# Patient Record
Sex: Female | Born: 2000 | Race: White | Hispanic: No | Marital: Single | Smoking: Never smoker
Health system: Southern US, Community
[De-identification: ages and names within clinical notes are randomized; demographics above are authoritative.]

## PROBLEM LIST (undated history)

## (undated) HISTORY — PX: NO PAST SURGERIES: SHX2092

---

## 2018-12-11 ENCOUNTER — Other Ambulatory Visit: Payer: Self-pay

## 2018-12-11 ENCOUNTER — Ambulatory Visit
Admission: EM | Admit: 2018-12-11 | Discharge: 2018-12-11 | Disposition: A | Discharge status: Home / Self Care | Payer: Medicaid Other | Attending: Emergency Medicine | Admitting: Emergency Medicine

## 2018-12-11 DIAGNOSIS — J029 Acute pharyngitis, unspecified: Secondary | ICD-10-CM | POA: Insufficient documentation

## 2018-12-11 DIAGNOSIS — J0100 Acute maxillary sinusitis, unspecified: Secondary | ICD-10-CM | POA: Diagnosis not present

## 2018-12-11 DIAGNOSIS — J0101 Acute recurrent maxillary sinusitis: Secondary | ICD-10-CM | POA: Diagnosis present

## 2018-12-11 LAB — RAPID STREP SCREEN (MED CTR MEBANE ONLY): Streptococcus, Group A Screen (Direct): NEGATIVE

## 2018-12-11 MED ORDER — AMOXICILLIN-POT CLAVULANATE 875-125 MG PO TABS: 1.0000 | ORAL_TABLET | Freq: Two times a day (BID) | ORAL | 0 refills | Status: DC

## 2018-12-11 NOTE — ED Triage Notes (Signed)
Patient complains of sore throat and fevers x 6 days.

## 2018-12-11 NOTE — Discharge Instructions (Addendum)
Take medication as prescribed. Rest. Drink plenty of fluids.  ° °Follow up with your primary care physician this week as needed. Return to Urgent care for new or worsening concerns.  ° °

## 2018-12-11 NOTE — ED Provider Notes (Signed)
[facility] ____________________________________________  Time seen: Approximately 1230 PM  I have reviewed the triage vital signs and the nursing notes.   HISTORY  Chief Complaint Sore Throat (APPT)   HPI Nichole Horton is a 17 y.o. female presenting for evaluation of 1 week of nasal congestion, sinus pressure, postnasal drainage and sore throat.  States occasional cough.  States sore throat is currently mild.  Continues to overall eat and drink well.  States the last 2 days she has had low-grade fevers at home subjectively.  Has been taken over-the-counter cough and congestion medication as well as Tylenol as needed which helps but no resolution.  Denies chest pain, shortness of breath or abdominal pain.  Denies recent sickness of his antibiotic use.  Reports otherwise doing well.  Patient's last menstrual period was 12/04/2018.  Denies pregnancy.  History reviewed. No pertinent past medical history.  There are no active problems to display for this patient.   Past Surgical History:  Procedure Laterality Date  . NO PAST SURGERIES       No current facility-administered medications for this encounter.   Current Outpatient Medications:  .  amoxicillin-clavulanate (AUGMENTIN) 875-125 MG tablet, Take 1 tablet by mouth every 12 (twelve) hours., Disp: 20 tablet, Rfl: 0  Allergies Patient has no known allergies.  History reviewed. No pertinent family history.  Social History Social History   Tobacco Use  . Smoking status: Never Smoker  . Smokeless tobacco: Never Used  Substance Use Topics  . Alcohol use: Not Currently  . Drug use: Not Currently    Review of Systems Constitutional: possible fevers.  ENT: as above Cardiovascular: Denies chest pain. Respiratory: Denies shortness of breath. Gastrointestinal: No abdominal pain.  Musculoskeletal: Negative for back pain. Skin: Negative for rash.   ____________________________________________   PHYSICAL  EXAM:  VITAL SIGNS: ED Triage Vitals  Enc Vitals Group     BP 12/11/18 1140 (!) 134/92     Pulse Rate 12/11/18 1140 88     Resp 12/11/18 1140 18     Temp 12/11/18 1140 98.5 F (36.9 C)     Temp Source 12/11/18 1140 Oral     SpO2 12/11/18 1140 100 %     Weight 12/11/18 1138 160 lb (72.6 kg)     Height 12/11/18 1138 5\' 4"  (1.626 m)     Head Circumference --      Peak Flow --      Pain Score 12/11/18 1138 6     Pain Loc --      Pain Edu? --      Excl. in GC? --     Constitutional: Alert and oriented. Well appearing and in no acute distress. Eyes: Conjunctivae are normal.  Head: Atraumatic.Mild to moderate tenderness to palpation bilateral maxillary sinuses.  Nontender frontal sinuses.  No swelling. No erythema.   Ears: no erythema, normal TMs bilaterally.   Nose: nasal congestion with bilateral nasal turbinate erythema and edema.   Mouth/Throat: Mucous membranes are moist.  Mild pharyngeal erythema.  No tonsillar swelling or exudate.  Neck: No stridor.  No cervical spine tenderness to palpation. Hematological/Lymphatic/Immunilogical: No cervical lymphadenopathy. Cardiovascular: Normal rate, regular rhythm. Grossly normal heart sounds.  Good peripheral circulation. Respiratory: Normal respiratory effort.  No retractions. No wheezes, rales or rhonchi. Good air movement.  Musculoskeletal: No cervical, thoracic or lumbar tenderness to palpation.  Neurologic:  Normal speech and language.No gait instability. Skin:  Skin is warm, dry and intact. No rash noted. Psychiatric: Mood and affect  are normal. Speech and behavior are normal.  ___________________________________________   LABS (all labs ordered are listed, but only abnormal results are displayed)  Labs Reviewed  RAPID STREP SCREEN ([facility] ONLY)  CULTURE, GROUP A STREP [facility])    PROCEDURES Procedures    INITIAL IMPRESSION / ASSESSMENT AND PLAN / ED COURSE  Pertinent labs & imaging results that were available  during my care of the patient were reviewed by me and considered in my medical decision making (see chart for details).  Well-appearing patient.  No acute distress.  Strep negative, will culture.  Suspect sinusitis with postnasal drainage.  Will treat with oral Augmentin.  Encourage rest, fluids, supportive care and over-the-counter medications.Discussed indication, risks and benefits of medications with patient.  Discussed follow up with Primary care physician this week. Discussed follow up and return parameters including no resolution or any worsening concerns. Patient verbalized understanding and agreed to plan.   ____________________________________________   FINAL CLINICAL IMPRESSION(S) / ED DIAGNOSES  Final diagnoses:  Acute maxillary sinusitis, recurrence not specified  Pharyngitis, unspecified etiology     ED Discharge Orders         Ordered    amoxicillin-clavulanate (AUGMENTIN) 875-125 MG tablet  Every 12 hours     12/11/18 1241           Note: This dictation was prepared with Dragon dictation along with smaller phrase technology. Any transcriptional errors that result from this process are unintentional.         Renford DillsMiller, Erubiel Manasco, NP 12/11/18 1455

## 2018-12-14 LAB — CULTURE, GROUP A STREP (THRC)

## 2019-07-24 ENCOUNTER — Encounter: Payer: Self-pay | Admitting: Emergency Medicine

## 2019-07-24 ENCOUNTER — Other Ambulatory Visit: Payer: Self-pay

## 2019-07-24 ENCOUNTER — Ambulatory Visit
Admission: EM | Admit: 2019-07-24 | Discharge: 2019-07-24 | Disposition: A | Discharge status: Home / Self Care | Payer: Medicaid Other | Attending: Emergency Medicine | Admitting: Emergency Medicine

## 2019-07-24 ENCOUNTER — Ambulatory Visit (INDEPENDENT_AMBULATORY_CARE_PROVIDER_SITE_OTHER): Payer: Self-pay

## 2019-07-24 DIAGNOSIS — M7751 Other enthesopathy of right foot: Secondary | ICD-10-CM

## 2019-07-24 DIAGNOSIS — M79671 Pain in right foot: Secondary | ICD-10-CM

## 2019-07-24 MED ORDER — MELOXICAM 15 MG PO TABS: 15.0000 mg | ORAL_TABLET | Freq: Every day | ORAL | 0 refills | Status: DC | PRN

## 2019-07-24 NOTE — ED Triage Notes (Signed)
Patient c/o right foot pain that started 2 weeks ago. She denies injury but reports very painful to walk on it.

## 2019-07-24 NOTE — ED Provider Notes (Signed)
[facility] ____________________________________________  Time seen: Approximately 10:31 AM  I have reviewed the triage vital signs and the nursing notes.   HISTORY  Chief Complaint Foot Pain   HPI Nichole Horton is a 18 y.o. female presenting for evaluation of right lateral foot pain present for approximately 2 weeks.  Patient states pain is primarily with walking and activity.  States minimal pain at rest.  Unresolved with occasional over-the-counter Aleve.  Patient reports that she is often on her feet and walking as she works most days and has to walk a lot.  Patient states around 2 weeks ago while she was walking she accidentally inverted her foot and unsure if this is what caused pain.  Has continued remain ambulatory.  Denies pain radiation, paresthesias or other discomfort.  States that she knows she has some calluses to bottom of both of her feet, but reports this is chronic and unchanged and not tender at callus site.  Denies recent cough, congestion, chest pain, shortness of breath or fever.  Patient's last menstrual period was 07/23/2019.  Denies pregnancy.     History reviewed. No pertinent past medical history.  There are no active problems to display for this patient.   Past Surgical History:  Procedure Laterality Date  . NO PAST SURGERIES       No current facility-administered medications for this encounter.   Current Outpatient Medications:  .  meloxicam (MOBIC) 15 MG tablet, Take 1 tablet (15 mg total) by mouth daily as needed., Disp: 14 tablet, Rfl: 0  Allergies Patient has no known allergies.  History reviewed. No pertinent family history.  Social History Social History   Tobacco Use  . Smoking status: Never Smoker  . Smokeless tobacco: Never Used  Substance Use Topics  . Alcohol use: Not Currently  . Drug use: Not Currently    Review of Systems Constitutional: No fever Cardiovascular: Denies chest pain. Respiratory: Denies  shortness of breath. Gastrointestinal: No abdominal pain.   Musculoskeletal: Positive right foot pain. Skin: Negative for rash.   ____________________________________________   PHYSICAL EXAM:  VITAL SIGNS: ED Triage Vitals  Enc Vitals Group     BP 07/24/19 0925 117/76     Pulse Rate 07/24/19 0925 85     Resp 07/24/19 0925 18     Temp 07/24/19 0925 98.2 F (36.8 C)     Temp Source 07/24/19 0925 Oral     SpO2 07/24/19 0925 100 %     Weight 07/24/19 0923 180 lb (81.6 kg)     Height 07/24/19 0923 5\' 3"  (1.6 m)     Head Circumference --      Peak Flow --      Pain Score 07/24/19 0923 8     Pain Loc --      Pain Edu? --      Excl. in GC? --     Constitutional: Alert and oriented. Well appearing and in no acute distress. Eyes: Conjunctivae are normal.  ENT      Head: Normocephalic and atraumatic. Cardiovascular: Normal rate, regular rhythm. Grossly normal heart sounds.  Good peripheral circulation. Respiratory: Normal respiratory effort without tachypnea nor retractions. Breath sounds are clear and equal bilaterally. No wheezes, rales, rhonchi. Musculoskeletal: Ambulatory with steady gait.  Bilateral pedal pulses equal and easily palpated. Except: Right foot proximal fourth and fifth metatarsal mild to moderate tenderness to direct palpation, no edema, right foot otherwise nontender.  Right plantar foot distal fifth metatarsal callus skin area without tenderness or  erythema.  Right foot with full range of motion present, normal to sensation, right lower extremity otherwise nontender. Neurologic:  Normal speech and language.  Skin:  Skin is warm, dry Psychiatric: Mood and affect are normal. Speech and behavior are normal. Patient exhibits appropriate insight and judgment   ___________________________________________   LABS (all labs ordered are listed, but only abnormal results are displayed)  Labs Reviewed - No data to display  RADIOLOGY  Dg Foot Complete Right  Result  Date: 07/24/2019 CLINICAL DATA:  Trauma to the right foot 2 weeks ago with pain. EXAM: RIGHT FOOT COMPLETE - 3+ VIEW COMPARISON:  None. FINDINGS: There is no evidence of fracture or dislocation. There is no evidence of arthropathy or other focal bone abnormality. Soft tissues are unremarkable. IMPRESSION: Negative. Electronically Signed   By: Abelardo Diesel M.D.   On: 07/24/2019 10:13   ____________________________________________   PROCEDURES Procedures    INITIAL IMPRESSION / ASSESSMENT AND PLAN / ED COURSE  Pertinent labs & imaging results that were available during my care of the patient were reviewed by me and considered in my medical decision making (see chart for details).  Well-appearing patient.  No acute distress.  Right foot pain present for the last 2 weeks.  Right foot x-ray as above, negative.  Suspect recent sprain injury and tendinitis.  Postop shoe given for support.  Counseled regarding use of good supportive shoes as well as to avoid callused areas.  Epson salt soaks.  Will treat with oral Mobic.  Ice, supportive care.  Follow-up with podiatry for continued complaints.Discussed indication, risks and benefits of medications with patient.  Discussed follow up with Primary care physician this week. Discussed follow up and return parameters including no resolution or any worsening concerns. Patient verbalized understanding and agreed to plan.   ____________________________________________   FINAL CLINICAL IMPRESSION(S) / ED DIAGNOSES  Final diagnoses:  Right foot pain  Tendinitis of right foot     ED Discharge Orders         Ordered    meloxicam (MOBIC) 15 MG tablet  Daily PRN     07/24/19 1032           Note: This dictation was prepared with Dragon dictation along with smaller phrase technology. Any transcriptional errors that result from this process are unintentional.         Marylene Land, NP 07/24/19 1222

## 2019-07-24 NOTE — Discharge Instructions (Signed)
Take medication as prescribed. Rest. Ice. Wear postop shoe.  Follow-up with podiatry as needed for continued pain.  Follow up with your primary care physician this week as needed. Return to Urgent care for new or worsening concerns.

## 2019-09-09 ENCOUNTER — Encounter: Payer: Self-pay | Admitting: Emergency Medicine

## 2019-09-09 ENCOUNTER — Ambulatory Visit
Admission: EM | Admit: 2019-09-09 | Discharge: 2019-09-09 | Disposition: A | Discharge status: Home / Self Care | Payer: Self-pay | Attending: Internal Medicine | Admitting: Internal Medicine

## 2019-09-09 ENCOUNTER — Other Ambulatory Visit: Payer: Self-pay

## 2019-09-09 DIAGNOSIS — J3089 Other allergic rhinitis: Secondary | ICD-10-CM

## 2019-09-09 LAB — RAPID STREP SCREEN (MED CTR MEBANE ONLY): Streptococcus, Group A Screen (Direct): NEGATIVE

## 2019-09-09 MED ORDER — FLUTICASONE PROPIONATE 50 MCG/ACT NA SUSP: 1.0000 | Freq: Every day | NASAL | 1 refills | Status: AC

## 2019-09-09 MED ORDER — ACETAMINOPHEN 500 MG PO TABS: 500.0000 mg | ORAL_TABLET | Freq: Four times a day (QID) | ORAL | 0 refills | Status: AC | PRN

## 2019-09-09 MED ORDER — CETIRIZINE HCL 10 MG PO TABS: 10.0000 mg | ORAL_TABLET | Freq: Every day | ORAL | 2 refills | Status: AC

## 2019-09-09 NOTE — ED Provider Notes (Signed)
[facility]    CSN: [id number] Arrival date & time: 09/09/19  0850      History   Chief Complaint Chief Complaint  Patient presents with  . Sore Throat    HPI Nichole Horton is a 18 y.o. female with no past medical history comes to urgent care with complaints of sore throat and nasal congestion of 5 days duration.  Patient says symptoms started 5 days ago and is been persistent.  No fever or chills.  Patient has seasonal allergies and has been experiencing nasal congestion with itching of the nose and eyes over the same period of time.  No shortness of breath, cough or sputum production.  No relieving factors.  Patient used to take Zantac and Flonase but has not used it recently.   HPI  History reviewed. No pertinent past medical history.  There are no active problems to display for this patient.   Past Surgical History:  Procedure Laterality Date  . NO PAST SURGERIES      OB History   No obstetric history on file.      Home Medications    Prior to Admission medications   Medication Sig Start Date End Date Taking? Authorizing Provider  acetaminophen (TYLENOL) 500 MG tablet Take 1 tablet (500 mg total) by mouth every 6 (six) hours as needed. 09/09/19   Merrilee Jansky, MD  cetirizine (ZYRTEC ALLERGY) 10 MG tablet Take 1 tablet (10 mg total) by mouth daily. 09/09/19   Merrilee Jansky, MD  fluticasone (FLONASE) 50 MCG/ACT nasal spray Place 1 spray into both nostrils daily. 09/09/19   Lamptey, Britta Mccreedy, MD    Family History History reviewed. No pertinent family history.  Social History Social History   Tobacco Use  . Smoking status: Never Smoker  . Smokeless tobacco: Never Used  Substance Use Topics  . Alcohol use: Not Currently  . Drug use: Not Currently     Allergies   Nystatin   Review of Systems Review of Systems  Constitutional: Negative.   HENT: Positive for congestion, postnasal drip, rhinorrhea and sore throat. Negative for sinus  pressure, sinus pain and voice change.   Respiratory: Negative.   Cardiovascular: Negative.   Gastrointestinal: Negative.      Physical Exam Triage Vital Signs ED Triage Vitals  Enc Vitals Group     BP 09/09/19 0906 (!) 142/82     Pulse Rate 09/09/19 0906 (!) 103     Resp 09/09/19 0906 18     Temp 09/09/19 0906 98.3 F (36.8 C)     Temp Source 09/09/19 0906 Oral     SpO2 09/09/19 0906 100 %     Weight 09/09/19 0902 180 lb (81.6 kg)     Height 09/09/19 0902 5\' 3"  (1.6 m)     Head Circumference --      Peak Flow --      Pain Score 09/09/19 0902 4     Pain Loc --      Pain Edu? --      Excl. in GC? --    No data found.  Updated Vital Signs BP (!) 142/82 (BP Location: Left Arm)   Pulse (!) 103   Temp 98.3 F (36.8 C) (Oral)   Resp 18   Ht 5\' 3"  (1.6 m)   Wt 81.6 kg   LMP 08/22/2019 (Approximate)   SpO2 100%   BMI 31.89 kg/m   Visual Acuity Right Eye Distance:   Left Eye Distance:  Bilateral Distance:    Right Eye Near:   Left Eye Near:    Bilateral Near:     Physical Exam Vitals signs and nursing note reviewed.  Constitutional:      General: She is not in acute distress.    Appearance: She is not ill-appearing or toxic-appearing.  HENT:     Right Ear: Tympanic membrane normal.     Left Ear: Tympanic membrane normal.     Mouth/Throat:     Mouth: Mucous membranes are moist.     Tonsils: No tonsillar exudate or tonsillar abscesses. 1+ on the right. 1+ on the left.  Eyes:     Extraocular Movements:     Right eye: Normal extraocular motion.     Left eye: Normal extraocular motion.     Conjunctiva/sclera: Conjunctivae normal.  Cardiovascular:     Rate and Rhythm: Normal rate and regular rhythm.     Heart sounds: Normal heart sounds.  Pulmonary:     Effort: Pulmonary effort is normal.     Breath sounds: Normal breath sounds.  Neurological:     Mental Status: She is alert.      UC Treatments / Results  Labs (all labs ordered are listed, but only  abnormal results are displayed) Labs Reviewed  RAPID STREP SCREEN ([facility] ONLY)  NOVEL CORONAVIRUS, NAA (HOSP ORDER, SEND-OUT TO REF LAB; TAT 18-24 HRS)  CULTURE, GROUP A STREP [facility])    EKG   Radiology No results found.  Procedures Procedures (including critical care time)  Medications Ordered in UC Medications - No data to display  Initial Impression / Assessment and Plan / UC Course  I have reviewed the triage vital signs and the nursing notes.  Pertinent labs & imaging results that were available during my care of the patient were reviewed by me and considered in my medical decision making (see chart for details).     1.  Seasonal allergic rhinitis: Flonase 1 spray in each nostril once daily Zyrtec 10 mg tablets orally daily Acetaminophen as needed for pain or fever. If patient symptoms worsens or if she develops fever, chills, sputum production or significant shortness of breath she needs to return to urgent care to be reevaluated. Final Clinical Impressions(s) / UC Diagnoses   Final diagnoses:  Seasonal allergic rhinitis due to other allergic trigger   Discharge Instructions   None    ED Prescriptions    Medication Sig Dispense Auth. Provider   fluticasone (FLONASE) 50 MCG/ACT nasal spray Place 1 spray into both nostrils daily. 16 g Chase Picket, MD   cetirizine (ZYRTEC ALLERGY) 10 MG tablet Take 1 tablet (10 mg total) by mouth daily. 30 tablet Lamptey, Myrene Galas, MD   acetaminophen (TYLENOL) 500 MG tablet Take 1 tablet (500 mg total) by mouth every 6 (six) hours as needed. 30 tablet Lamptey, Myrene Galas, MD     PDMP not reviewed this encounter.   Chase Picket, MD 09/09/19 1540

## 2019-09-09 NOTE — ED Triage Notes (Signed)
Pt c/o sore throat and nasal congestion. Started about 5 days ago. denies fever.

## 2019-09-11 LAB — NOVEL CORONAVIRUS, NAA (HOSP ORDER, SEND-OUT TO REF LAB; TAT 18-24 HRS): SARS-CoV-2, NAA: NOT DETECTED

## 2019-09-12 ENCOUNTER — Telehealth (HOSPITAL_COMMUNITY): Payer: Self-pay | Admitting: Emergency Medicine

## 2019-09-12 LAB — CULTURE, GROUP A STREP (THRC)

## 2019-09-12 NOTE — Telephone Encounter (Signed)
Culture is positive for non group A Strep germ.  This is a finding of uncertain significance; not the typical 'strep throat' germ.  Attempted to reach patient to see how they were feeling. No answer at this time.

## 2020-06-26 IMAGING — CR RIGHT FOOT COMPLETE - 3+ VIEW
3 series · 3 of 3 positions shown · non-contrast
Comparison: None.

CLINICAL DATA: Trauma to the right foot 2 weeks ago with pain.

EXAM:
RIGHT FOOT COMPLETE - 3+ VIEW

[foot ap]
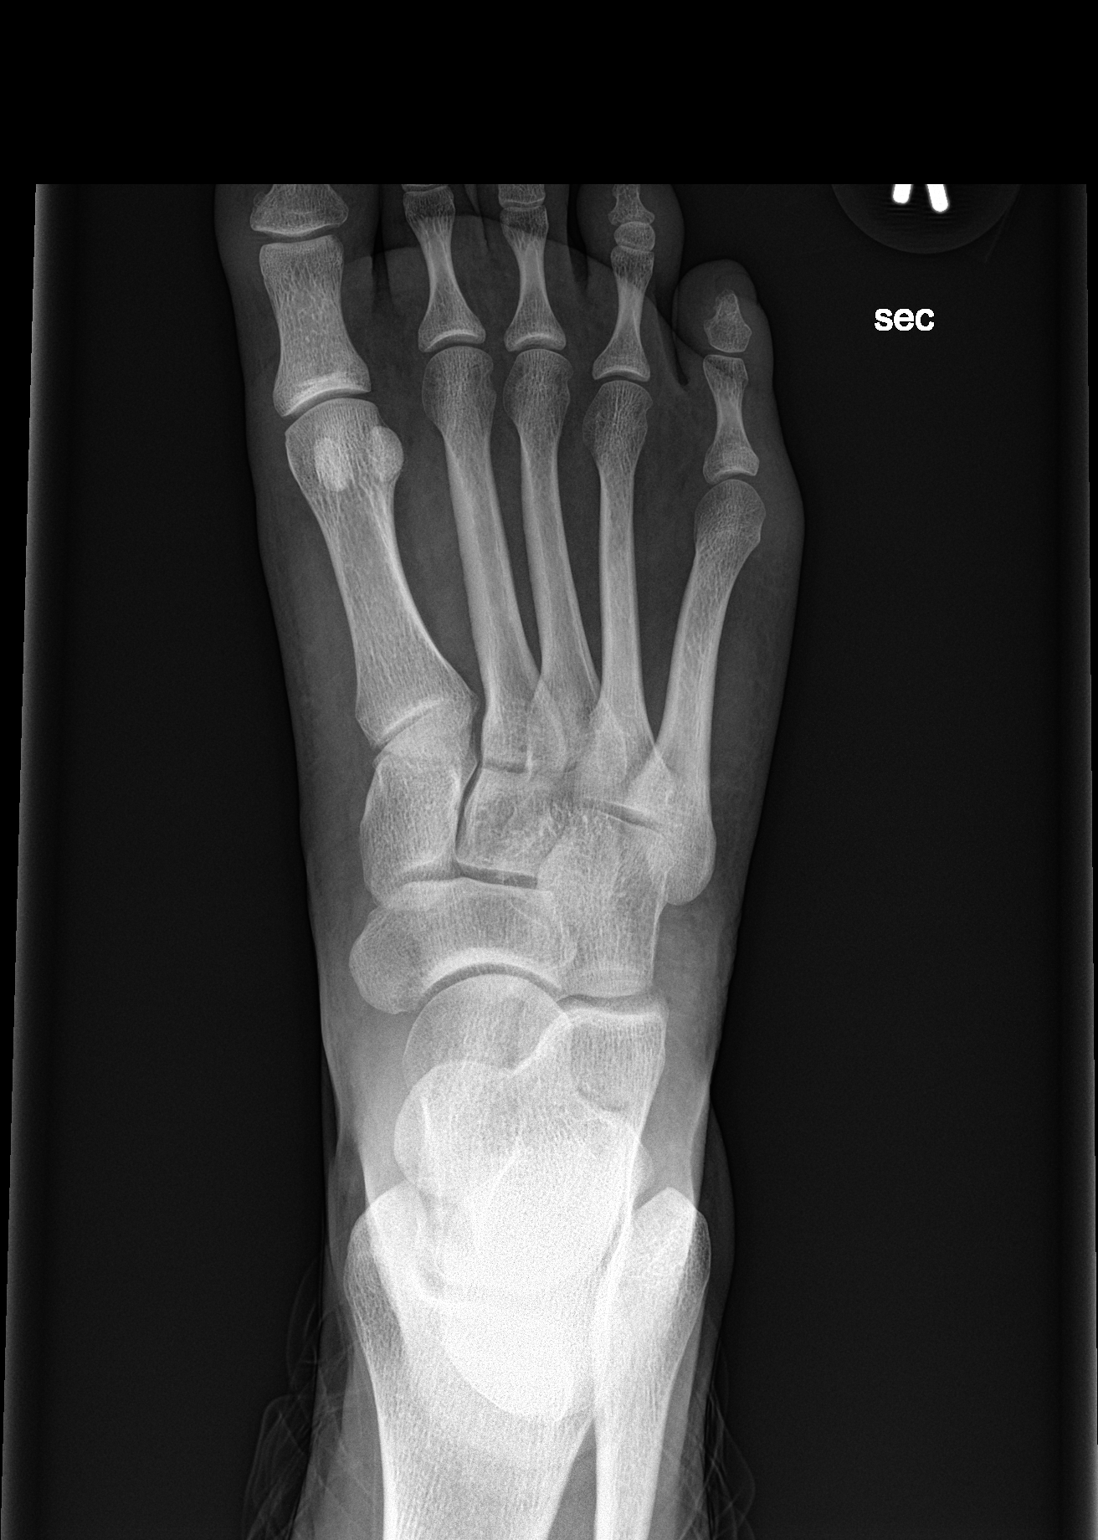

[foot obl]
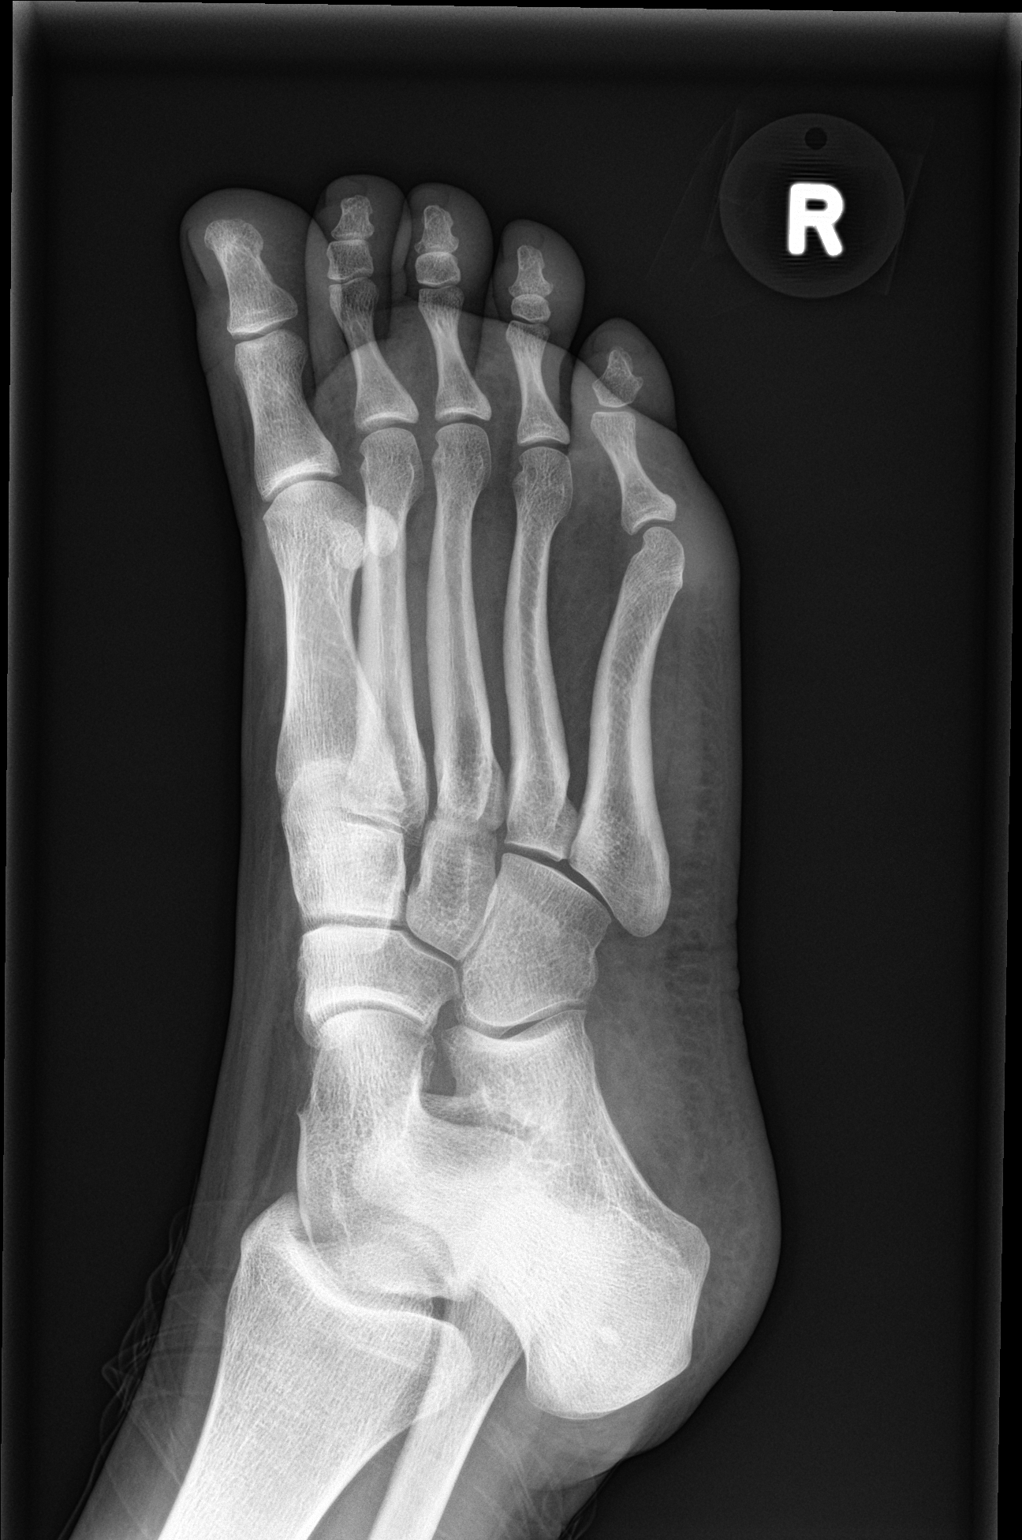

[foot lat]
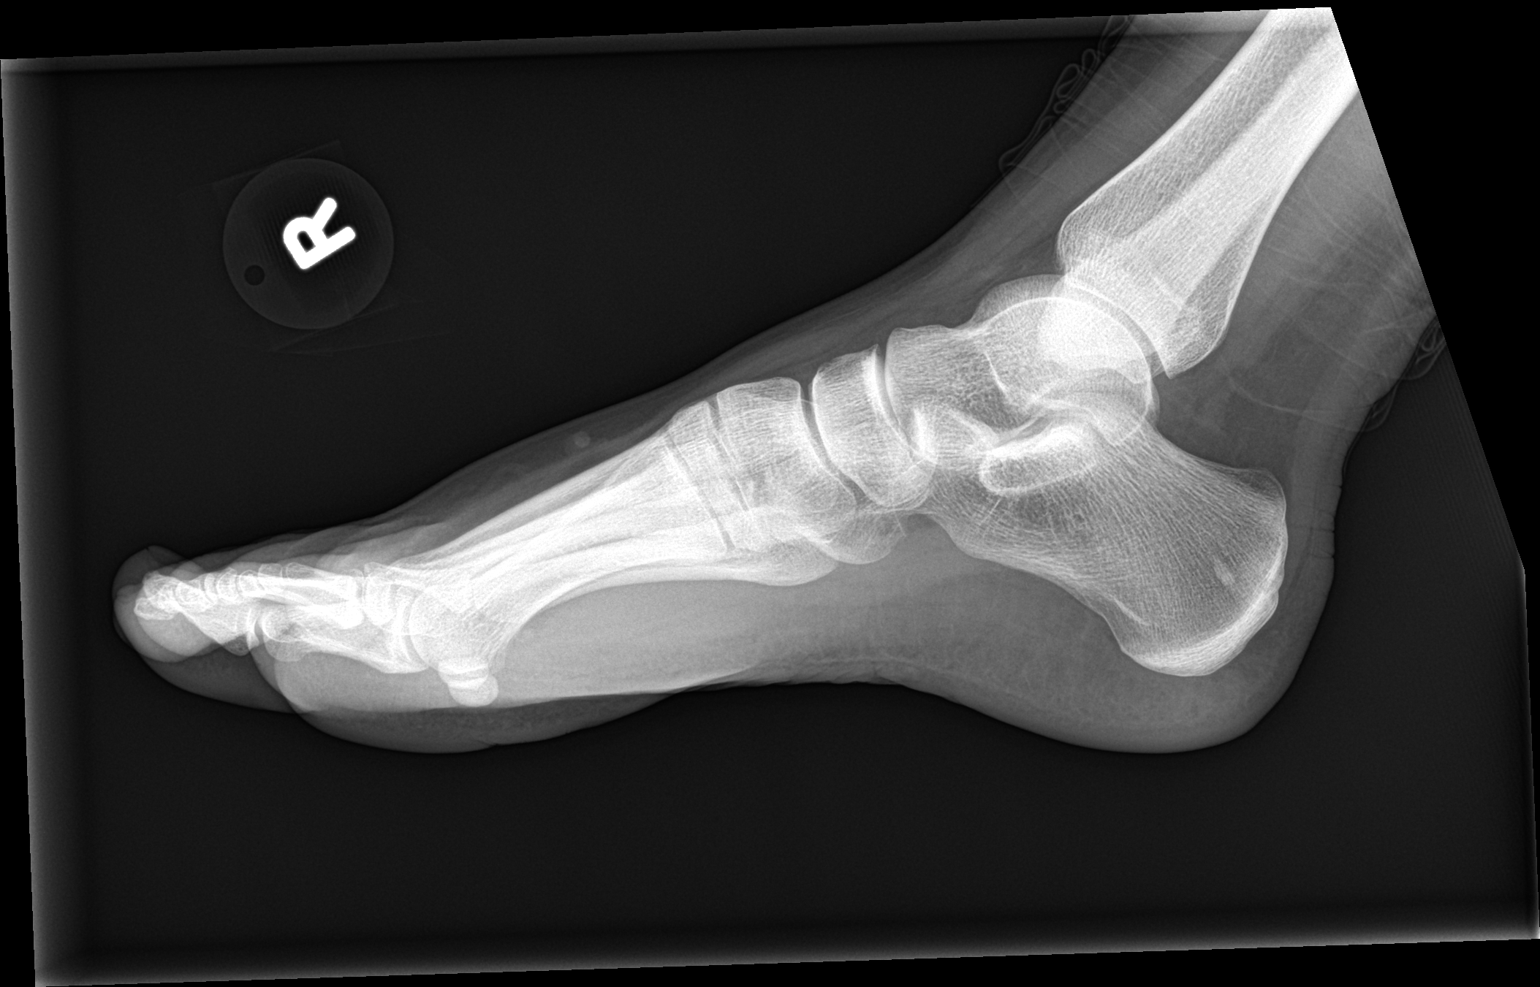

[3 of 3 positions shown; findings below may reference images not displayed]

FINDINGS: There is no evidence of fracture or dislocation. There is no
evidence of arthropathy or other focal bone abnormality. Soft
tissues are unremarkable.
IMPRESSION: Negative.
